# Patient Record
Sex: Male | Born: 1960 | Race: Black or African American | Hispanic: No | Marital: Single | Smoking: Current every day smoker
Health system: Southern US, Community
[De-identification: ages and names within clinical notes are randomized; demographics above are authoritative.]

---

## 1999-05-03 ENCOUNTER — Encounter: Admission: RE | Admit: 1999-05-03 | Discharge: 1999-05-28 | Payer: Self-pay | Admitting: *Deleted

## 1999-06-10 ENCOUNTER — Encounter: Admission: RE | Admit: 1999-06-10 | Discharge: 1999-08-16 | Payer: Self-pay

## 2003-01-23 ENCOUNTER — Encounter: Payer: Self-pay | Admitting: Emergency Medicine

## 2003-01-23 ENCOUNTER — Encounter: Payer: Self-pay | Admitting: Orthopedic Surgery

## 2003-01-24 ENCOUNTER — Inpatient Hospital Stay (HOSPITAL_COMMUNITY): Admission: EM | Admit: 2003-01-24 | Discharge: 2003-01-27 | Payer: Self-pay | Admitting: Emergency Medicine

## 2003-01-24 ENCOUNTER — Encounter: Payer: Self-pay | Admitting: Orthopedic Surgery

## 2003-07-19 ENCOUNTER — Emergency Department (HOSPITAL_COMMUNITY): Admission: EM | Admit: 2003-07-19 | Discharge: 2003-07-20 | Payer: Self-pay

## 2003-11-25 ENCOUNTER — Emergency Department (HOSPITAL_COMMUNITY): Admission: EM | Admit: 2003-11-25 | Discharge: 2003-11-25 | Payer: Self-pay | Admitting: *Deleted

## 2004-06-12 ENCOUNTER — Emergency Department (HOSPITAL_COMMUNITY): Admission: EM | Admit: 2004-06-12 | Discharge: 2004-06-13 | Payer: Self-pay | Admitting: Emergency Medicine

## 2004-06-16 ENCOUNTER — Ambulatory Visit (HOSPITAL_BASED_OUTPATIENT_CLINIC_OR_DEPARTMENT_OTHER): Admission: RE | Admit: 2004-06-16 | Discharge: 2004-06-16 | Payer: Self-pay | Admitting: Orthopedic Surgery

## 2004-06-16 ENCOUNTER — Ambulatory Visit (HOSPITAL_COMMUNITY): Admission: RE | Admit: 2004-06-16 | Discharge: 2004-06-16 | Payer: Self-pay | Admitting: Orthopedic Surgery

## 2004-09-04 ENCOUNTER — Emergency Department (HOSPITAL_COMMUNITY): Admission: EM | Admit: 2004-09-04 | Discharge: 2004-09-05 | Payer: Self-pay | Admitting: Emergency Medicine

## 2016-05-14 ENCOUNTER — Emergency Department (HOSPITAL_COMMUNITY)
Admission: EM | Admit: 2016-05-14 | Discharge: 2016-05-14 | Disposition: A | Discharge status: Home / Self Care | Payer: No Typology Code available for payment source | Attending: Emergency Medicine | Admitting: Emergency Medicine

## 2016-05-14 ENCOUNTER — Emergency Department (HOSPITAL_COMMUNITY): Payer: No Typology Code available for payment source

## 2016-05-14 ENCOUNTER — Encounter (HOSPITAL_COMMUNITY): Payer: Self-pay | Admitting: *Deleted

## 2016-05-14 DIAGNOSIS — Y92410 Unspecified street and highway as the place of occurrence of the external cause: Secondary | ICD-10-CM | POA: Diagnosis not present

## 2016-05-14 DIAGNOSIS — Y939 Activity, unspecified: Secondary | ICD-10-CM | POA: Diagnosis not present

## 2016-05-14 DIAGNOSIS — Y999 Unspecified external cause status: Secondary | ICD-10-CM | POA: Diagnosis not present

## 2016-05-14 DIAGNOSIS — S65504A Unspecified injury of blood vessel of right ring finger, initial encounter: Secondary | ICD-10-CM | POA: Diagnosis present

## 2016-05-14 DIAGNOSIS — Z23 Encounter for immunization: Secondary | ICD-10-CM | POA: Diagnosis not present

## 2016-05-14 DIAGNOSIS — T148XXA Other injury of unspecified body region, initial encounter: Secondary | ICD-10-CM

## 2016-05-14 DIAGNOSIS — F17200 Nicotine dependence, unspecified, uncomplicated: Secondary | ICD-10-CM | POA: Insufficient documentation

## 2016-05-14 DIAGNOSIS — S80812A Abrasion, left lower leg, initial encounter: Secondary | ICD-10-CM | POA: Diagnosis not present

## 2016-05-14 DIAGNOSIS — S62634A Displaced fracture of distal phalanx of right ring finger, initial encounter for closed fracture: Secondary | ICD-10-CM | POA: Diagnosis not present

## 2016-05-14 DIAGNOSIS — Z79899 Other long term (current) drug therapy: Secondary | ICD-10-CM | POA: Diagnosis not present

## 2016-05-14 DIAGNOSIS — S62609A Fracture of unspecified phalanx of unspecified finger, initial encounter for closed fracture: Secondary | ICD-10-CM

## 2016-05-14 LAB — I-STAT CHEM 8, ED
BUN: 12 mg/dL (ref 6–20)
CREATININE: 1.5 mg/dL — AB (ref 0.61–1.24)
Calcium, Ion: 1.1 mmol/L — ABNORMAL LOW (ref 1.12–1.23)
Chloride: 103 mmol/L (ref 101–111)
Glucose, Bld: 89 mg/dL (ref 65–99)
HEMATOCRIT: 47 % (ref 39.0–52.0)
Hemoglobin: 16 g/dL (ref 13.0–17.0)
POTASSIUM: 3.6 mmol/L (ref 3.5–5.1)
Sodium: 144 mmol/L (ref 135–145)
TCO2: 26 mmol/L (ref 0–100)

## 2016-05-14 LAB — COMPREHENSIVE METABOLIC PANEL
ALBUMIN: 3.7 g/dL (ref 3.5–5.0)
ALK PHOS: 100 U/L (ref 38–126)
ALT: 50 U/L (ref 17–63)
ANION GAP: 10 (ref 5–15)
AST: 51 U/L — ABNORMAL HIGH (ref 15–41)
BUN: 11 mg/dL (ref 6–20)
CALCIUM: 9 mg/dL (ref 8.9–10.3)
CHLORIDE: 105 mmol/L (ref 101–111)
CO2: 27 mmol/L (ref 22–32)
Creatinine, Ser: 1.28 mg/dL — ABNORMAL HIGH (ref 0.61–1.24)
GFR calc non Af Amer: >60 mL/min (ref >60)
GLUCOSE: 95 mg/dL (ref 65–99)
POTASSIUM: 3.6 mmol/L (ref 3.5–5.1)
SODIUM: 142 mmol/L (ref 135–145)
Total Bilirubin: 0.4 mg/dL (ref 0.3–1.2)
Total Protein: 6.7 g/dL (ref 6.5–8.1)

## 2016-05-14 LAB — CBC
HEMATOCRIT: 43.8 % (ref 39.0–52.0)
HEMOGLOBIN: 14.8 g/dL (ref 13.0–17.0)
MCH: 29.9 pg (ref 26.0–34.0)
MCHC: 33.8 g/dL (ref 30.0–36.0)
MCV: 88.5 fL (ref 78.0–100.0)
Platelets: 309 10*3/uL (ref 150–400)
RBC: 4.95 MIL/uL (ref 4.22–5.81)
RDW: 15.8 % — ABNORMAL HIGH (ref 11.5–15.5)
WBC: 5.6 10*3/uL (ref 4.0–10.5)

## 2016-05-14 LAB — URINALYSIS, ROUTINE W REFLEX MICROSCOPIC
Bilirubin Urine: NEGATIVE
GLUCOSE, UA: NEGATIVE mg/dL
HGB URINE DIPSTICK: NEGATIVE
Ketones, ur: NEGATIVE mg/dL
LEUKOCYTES UA: NEGATIVE
Nitrite: NEGATIVE
PROTEIN: NEGATIVE mg/dL
SPECIFIC GRAVITY, URINE: 1.024 (ref 1.005–1.030)
pH: 5.5 (ref 5.0–8.0)

## 2016-05-14 LAB — PROTIME-INR
INR: 1 (ref 0.00–1.49)
Prothrombin Time: 13.4 seconds (ref 11.6–15.2)

## 2016-05-14 LAB — CDS SEROLOGY

## 2016-05-14 LAB — SAMPLE TO BLOOD BANK

## 2016-05-14 LAB — I-STAT CG4 LACTIC ACID, ED: LACTIC ACID, VENOUS: 1.35 mmol/L (ref 0.5–2.0)

## 2016-05-14 LAB — ETHANOL: ALCOHOL ETHYL (B): 138 mg/dL — AB (ref <5)

## 2016-05-14 MED ORDER — TETANUS-DIPHTHERIA TOXOIDS TD 5-2 LFU IM INJ
0.5000 mL | INJECTION | Freq: Once | INTRAMUSCULAR | Status: DC
  Filled 2016-05-14: qty 0.5

## 2016-05-14 MED ORDER — IOPAMIDOL (ISOVUE-300) INJECTION 61%
INTRAVENOUS | Status: AC
  Administered 2016-05-14: 100 mL
  Filled 2016-05-14: qty 100

## 2016-05-14 MED ORDER — TETANUS-DIPHTH-ACELL PERTUSSIS 5-2.5-18.5 LF-MCG/0.5 IM SUSP
0.5000 mL | Freq: Once | INTRAMUSCULAR | Status: AC
  Administered 2016-05-14: 0.5 mL via INTRAMUSCULAR

## 2016-05-14 MED ORDER — LIDOCAINE HCL 2 % IJ SOLN
10.0000 mL | Freq: Once | INTRAMUSCULAR | Status: AC
  Administered 2016-05-14: 200 mg
  Filled 2016-05-14: qty 20

## 2016-05-14 MED ORDER — OXYCODONE-ACETAMINOPHEN 5-325 MG PO TABS
1.0000 | ORAL_TABLET | Freq: Once | ORAL | Status: AC
  Administered 2016-05-14: 1 via ORAL
  Filled 2016-05-14: qty 1

## 2016-05-14 MED ORDER — SULFAMETHOXAZOLE-TRIMETHOPRIM 800-160 MG PO TABS: 1.0000 | ORAL_TABLET | Freq: Two times a day (BID) | ORAL | Status: AC

## 2016-05-14 NOTE — ED Notes (Signed)
Pain med given and finger splinted rt ring finger
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
## 2016-05-14 NOTE — ED Notes (Signed)
To ct

## 2016-05-14 NOTE — ED Notes (Signed)
Sleeping will respond to loud questions  Snoring loudly
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
## 2016-05-14 NOTE — ED Notes (Signed)
Provided pt with a [place] sandwich and coffee per Clydie BraunKaren, [place]
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
## 2017-12-11 ENCOUNTER — Emergency Department (HOSPITAL_COMMUNITY)
Admission: EM | Admit: 2017-12-11 | Discharge: 2017-12-19 | Disposition: E | Payer: Medicare Other | Attending: Emergency Medicine | Admitting: Emergency Medicine

## 2017-12-11 DIAGNOSIS — F17200 Nicotine dependence, unspecified, uncomplicated: Secondary | ICD-10-CM | POA: Diagnosis not present

## 2017-12-11 DIAGNOSIS — I469 Cardiac arrest, cause unspecified: Secondary | ICD-10-CM | POA: Insufficient documentation
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
## 2017-12-19 NOTE — ED Notes (Signed)
Reference Number: [id number] RN  spoke with Erasmo ScorePatrick Small.
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
## 2017-12-19 DEATH — deceased
